# Patient Record
Sex: Female | Born: 1995 | Race: Black or African American | Hispanic: No | Marital: Single | Smoking: Never smoker
Health system: Southern US, Community
[De-identification: ages and names within clinical notes are randomized; demographics above are authoritative.]

## PROBLEM LIST (undated history)

## (undated) DIAGNOSIS — Q858 Other phakomatoses, not elsewhere classified: Secondary | ICD-10-CM

## (undated) DIAGNOSIS — N809 Endometriosis, unspecified: Secondary | ICD-10-CM

## (undated) DIAGNOSIS — R569 Unspecified convulsions: Secondary | ICD-10-CM

## (undated) DIAGNOSIS — I509 Heart failure, unspecified: Secondary | ICD-10-CM

## (undated) DIAGNOSIS — G931 Anoxic brain damage, not elsewhere classified: Secondary | ICD-10-CM

## (undated) DIAGNOSIS — K561 Intussusception: Secondary | ICD-10-CM

## (undated) DIAGNOSIS — I2510 Atherosclerotic heart disease of native coronary artery without angina pectoris: Secondary | ICD-10-CM

## (undated) HISTORY — PX: CARDIAC DEFIBRILLATOR PLACEMENT: SHX171

## (undated) HISTORY — PX: OTHER SURGICAL HISTORY: SHX169

## (undated) HISTORY — PX: ABDOMINAL SURGERY: SHX537

---
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
## 2016-10-05 ENCOUNTER — Encounter (HOSPITAL_COMMUNITY): Payer: Self-pay | Admitting: *Deleted

## 2016-10-05 ENCOUNTER — Emergency Department (HOSPITAL_COMMUNITY)
Admission: EM | Admit: 2016-10-05 | Discharge: 2016-10-05 | Disposition: A | Discharge status: Home / Self Care | Payer: Medicaid Other | Attending: Emergency Medicine | Admitting: Emergency Medicine

## 2016-10-05 DIAGNOSIS — R52 Pain, unspecified: Secondary | ICD-10-CM | POA: Diagnosis present

## 2016-10-05 DIAGNOSIS — Z5321 Procedure and treatment not carried out due to patient leaving prior to being seen by health care provider: Secondary | ICD-10-CM | POA: Diagnosis not present

## 2016-10-05 HISTORY — DX: Endometriosis, unspecified: N80.9

## 2016-10-05 HISTORY — DX: Atherosclerotic heart disease of native coronary artery without angina pectoris: I25.10

## 2016-10-05 HISTORY — DX: Intussusception: K56.1

## 2016-10-05 HISTORY — DX: Anoxic brain damage, not elsewhere classified: G93.1

## 2016-10-05 HISTORY — DX: Other phakomatoses, not elsewhere classified: Q85.8

## 2016-10-05 NOTE — ED Triage Notes (Signed)
Mother states pt grunts and cries when she tries to place her flat in bed, this started Sunday. Unknown location or reason for pain,

## 2016-10-05 NOTE — ED Notes (Signed)
Called to take to room  No response from lobby  

## 2016-10-12 ENCOUNTER — Encounter (HOSPITAL_COMMUNITY): Payer: Self-pay | Admitting: *Deleted

## 2016-10-12 ENCOUNTER — Emergency Department (HOSPITAL_COMMUNITY)
Admission: EM | Admit: 2016-10-12 | Discharge: 2016-10-13 | Disposition: A | Discharge status: Home / Self Care | Payer: Medicaid Other | Attending: Emergency Medicine | Admitting: Emergency Medicine

## 2016-10-12 DIAGNOSIS — Z5321 Procedure and treatment not carried out due to patient leaving prior to being seen by health care provider: Secondary | ICD-10-CM | POA: Insufficient documentation

## 2016-10-12 DIAGNOSIS — K5900 Constipation, unspecified: Secondary | ICD-10-CM | POA: Insufficient documentation

## 2016-10-12 LAB — COMPREHENSIVE METABOLIC PANEL
ALK PHOS: 64 U/L (ref 38–126)
ALT: 18 U/L (ref 14–54)
ANION GAP: 9 (ref 5–15)
AST: 21 U/L (ref 15–41)
Albumin: 4.2 g/dL (ref 3.5–5.0)
BILIRUBIN TOTAL: 0.5 mg/dL (ref 0.3–1.2)
BUN: 12 mg/dL (ref 6–20)
CALCIUM: 9.7 mg/dL (ref 8.9–10.3)
CO2: 23 mmol/L (ref 22–32)
CREATININE: 0.4 mg/dL — AB (ref 0.44–1.00)
Chloride: 108 mmol/L (ref 101–111)
GFR calc non Af Amer: >60 mL/min (ref >60)
GLUCOSE: 90 mg/dL (ref 65–99)
Potassium: 4 mmol/L (ref 3.5–5.1)
SODIUM: 140 mmol/L (ref 135–145)
TOTAL PROTEIN: 8.2 g/dL — AB (ref 6.5–8.1)

## 2016-10-12 LAB — CBC
HCT: 34.8 % — ABNORMAL LOW (ref 36.0–46.0)
HEMOGLOBIN: 10.6 g/dL — AB (ref 12.0–15.0)
MCH: 23.3 pg — AB (ref 26.0–34.0)
MCHC: 30.5 g/dL (ref 30.0–36.0)
MCV: 76.5 fL — ABNORMAL LOW (ref 78.0–100.0)
PLATELETS: 172 10*3/uL (ref 150–400)
RBC: 4.55 MIL/uL (ref 3.87–5.11)
RDW: 16.5 % — ABNORMAL HIGH (ref 11.5–15.5)
WBC: 6.3 10*3/uL (ref 4.0–10.5)

## 2016-10-12 LAB — LIPASE, BLOOD: Lipase: 42 U/L (ref 11–51)

## 2016-10-12 NOTE — ED Triage Notes (Signed)
Pt's mother reports when pt woke up today she is not her usual self.  Reports pt has been grunting.  States pt normally does not have a BM for a week which is normal for pt, she is due today and has not had one.  G-tube in place.  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

## 2016-10-12 NOTE — ED Notes (Signed)
Bed: WTR5 Expected date:  Expected time:  Means of arrival:  Comments: 

## 2016-10-13 NOTE — ED Notes (Addendum)
Pt's mother reports she is taking the pt home and will take her to [facility].  This nurse apologized for the long wait and was instructed to return if pt worsens.  She verbalized understanding.

## 2016-10-14 ENCOUNTER — Emergency Department: Payer: Medicaid Other

## 2016-10-14 ENCOUNTER — Emergency Department
Admission: EM | Admit: 2016-10-14 | Discharge: 2016-10-14 | Disposition: A | Discharge status: Home / Self Care | Payer: Medicaid Other | Attending: Emergency Medicine | Admitting: Emergency Medicine

## 2016-10-14 DIAGNOSIS — K5900 Constipation, unspecified: Secondary | ICD-10-CM | POA: Diagnosis not present

## 2016-10-14 DIAGNOSIS — I2510 Atherosclerotic heart disease of native coronary artery without angina pectoris: Secondary | ICD-10-CM | POA: Insufficient documentation

## 2016-10-14 DIAGNOSIS — R1084 Generalized abdominal pain: Secondary | ICD-10-CM | POA: Insufficient documentation

## 2016-10-14 DIAGNOSIS — R4182 Altered mental status, unspecified: Secondary | ICD-10-CM | POA: Diagnosis not present

## 2016-10-14 DIAGNOSIS — I5042 Chronic combined systolic (congestive) and diastolic (congestive) heart failure: Secondary | ICD-10-CM | POA: Diagnosis not present

## 2016-10-14 HISTORY — DX: Heart failure, unspecified: I50.9

## 2016-10-14 LAB — COMPREHENSIVE METABOLIC PANEL
ALK PHOS: 66 U/L (ref 38–126)
ALT: 16 U/L (ref 14–54)
AST: 19 U/L (ref 15–41)
Albumin: 4.6 g/dL (ref 3.5–5.0)
Anion gap: 10 (ref 5–15)
BILIRUBIN TOTAL: 0.7 mg/dL (ref 0.3–1.2)
BUN: 6 mg/dL (ref 6–20)
CALCIUM: 9.9 mg/dL (ref 8.9–10.3)
CO2: 23 mmol/L (ref 22–32)
CREATININE: 0.53 mg/dL (ref 0.44–1.00)
Chloride: 105 mmol/L (ref 101–111)
Glucose, Bld: 76 mg/dL (ref 65–99)
Potassium: 3.7 mmol/L (ref 3.5–5.1)
Sodium: 138 mmol/L (ref 135–145)
Total Protein: 8.7 g/dL — ABNORMAL HIGH (ref 6.5–8.1)

## 2016-10-14 LAB — CBC WITH DIFFERENTIAL/PLATELET
BASOS PCT: 1 %
Basophils Absolute: 0 10*3/uL (ref 0–0.1)
EOS ABS: 0.1 10*3/uL (ref 0–0.7)
Eosinophils Relative: 4 %
HCT: 34.7 % — ABNORMAL LOW (ref 35.0–47.0)
HEMOGLOBIN: 11 g/dL — AB (ref 12.0–16.0)
Lymphocytes Relative: 57 %
Lymphs Abs: 1.8 10*3/uL (ref 1.0–3.6)
MCH: 23.7 pg — ABNORMAL LOW (ref 26.0–34.0)
MCHC: 31.7 g/dL — AB (ref 32.0–36.0)
MCV: 74.9 fL — ABNORMAL LOW (ref 80.0–100.0)
Monocytes Absolute: 0.3 10*3/uL (ref 0.2–0.9)
Monocytes Relative: 9 %
NEUTROS PCT: 29 %
Neutro Abs: 0.9 10*3/uL — ABNORMAL LOW (ref 1.4–6.5)
PLATELETS: 162 10*3/uL (ref 150–440)
RBC: 4.64 MIL/uL (ref 3.80–5.20)
RDW: 17.2 % — AB (ref 11.5–14.5)
WBC: 3.1 10*3/uL — AB (ref 3.6–11.0)

## 2016-10-14 LAB — LIPASE, BLOOD: Lipase: 39 U/L (ref 11–51)

## 2016-10-14 LAB — HCG, QUANTITATIVE, PREGNANCY

## 2016-10-14 MED ORDER — SORBITOL 70 % SOLN
960.0000 mL | TOPICAL_OIL | ORAL | Status: AC
  Administered 2016-10-14: 960 mL via RECTAL
  Filled 2016-10-14: qty 240

## 2016-10-14 MED ORDER — IOPAMIDOL (ISOVUE-300) INJECTION 61%
30.0000 mL | Freq: Once | INTRAVENOUS | Status: AC
  Administered 2016-10-14: 30 mL via ORAL
  Filled 2016-10-14: qty 30

## 2016-10-14 MED ORDER — KETOROLAC TROMETHAMINE 30 MG/ML IJ SOLN
15.0000 mg | INTRAMUSCULAR | Status: AC
  Administered 2016-10-14: 15 mg via INTRAVENOUS
  Filled 2016-10-14: qty 1

## 2016-10-14 MED ORDER — IOPAMIDOL (ISOVUE-300) INJECTION 61%
75.0000 mL | Freq: Once | INTRAVENOUS | Status: AC | PRN
  Administered 2016-10-14: 75 mL via INTRAVENOUS
  Filled 2016-10-14: qty 75

## 2016-10-14 NOTE — ED Notes (Signed)
Attempt X 1 for lab draw, unsuccessful.

## 2016-10-14 NOTE — ED Notes (Signed)
Pharmacy called about enema, states they are working on it and will tube it to ED

## 2016-10-14 NOTE — ED Provider Notes (Signed)
[facility] Emergency Department Provider Note  ____________________________________________  Time seen: Approximately 3:33 PM  I have reviewed the triage vital signs and the nursing notes.   HISTORY  Chief Complaint Constipation  Level 5 Caveat: Portions of the History and Physical were unable to be obtained due to altered mental statusdue to chronic anoxic brain injury and nonverbal state. history obtained from mother at bedside  HPI Mallory Gonzalez is a 21 y.o. female brought to the ED due to constipation. The patient has not had a bowel movement in 2 weeks. Normally she has a bowel movement once a week. When she did not have a bowel movement a week ago, her mother started giving her MiraLAX. She's had 3 full cups of MiraLAX over the past few days without any stool passage whatsoever. The patient does not take anything by mouth and is fed solely through a G-tube. She's had multiple abdominal surgeries. She is bedbound mother reports the patient has exhibited grunting and crying which she interprets as indicative of abdominal pain.    Past Medical History:  Diagnosis Date  . Anoxia of brain (HCC)   . CHF (congestive heart failure) (HCC)   . Coronary artery disease   . Endometriosis   . Intussusception (HCC)   . Peutz-Jeghers polyps of small bowel (HCC)      There are no active problems to display for this patient.    Past Surgical History:  Procedure Laterality Date  . ABDOMINAL SURGERY    . CARDIAC DEFIBRILLATOR PLACEMENT    . micky button       Prior to Admission medications   Not on File     Allergies Aspirin and Penicillins   No family history on file.  Social History Social History  Substance Use Topics  . Smoking status: Never Smoker  . Smokeless tobacco: Never Used  . Alcohol use No    Review of Systems Limited by patient's nonverbal state Constitutional:   No fever or chills.   Gastrointestinal:   positive for  abdominal pain and constipation.   ____________________________________________   PHYSICAL EXAM:  VITAL SIGNS: ED Triage Vitals [10/14/16 1425]  Enc Vitals Group     BP (!) 89/65     Pulse Rate 87     Resp 20     Temp 99.3 F (37.4 C)     Temp Source Axillary     SpO2 100 %     Weight 110 lb (49.9 kg)     Height      Head Circumference      Peak Flow      Pain Score      Pain Loc      Pain Edu?      Excl. in GC?     Vital signs reviewed, nursing assessments reviewed.   Constitutional:   awake. not in distress. Eyes:   No scleral icterus.   PERRL. ENT   Head:   Normocephalic and atraumatic.   Nose:   No congestion/rhinnorhea.    Mouth/Throat:   MMM, no pharyngeal erythema. No peritonsillar mass.    Neck:   No meningismus. Full ROM Hematological/Lymphatic/Immunilogical:   No cervical lymphadenopathy. Cardiovascular:   RRR. Symmetric bilateral radial and DP pulses.  No murmurs.  Respiratory:   Normal respiratory effort without tachypnea/retractions. Breath sounds are clear and equal bilaterally. No wheezes/rales/rhonchi. Gastrointestinal:   Soft and nontender.mildly distended.   No rebound, rigidity, or guarding. Genitourinary:   deferred Musculoskeletal:   Normal  range of motion in all extremities. No joint effusions.  No lower extremity tenderness.  No edema. Neurologic:   Nonverbal Severe contractures of upper extremities.  at apparent neurologic baseline per mother No gross focal neurologic deficits are appreciated.  Skin:    Skin is warm, dry and intact. No rash noted.  No petechiae, purpura, or bullae.  ____________________________________________    LABS (pertinent positives/negatives) (all labs ordered are listed, but only abnormal results are displayed) Labs Reviewed  COMPREHENSIVE METABOLIC PANEL - Abnormal; Notable for the following:       Result Value   Total Protein 8.7 (*)    All other components within normal limits  CBC WITH  DIFFERENTIAL/PLATELET - Abnormal; Notable for the following:    WBC 3.1 (*)    Hemoglobin 11.0 (*)    HCT 34.7 (*)    MCV 74.9 (*)    MCH 23.7 (*)    MCHC 31.7 (*)    RDW 17.2 (*)    Neutro Abs 0.9 (*)    All other components within normal limits  LIPASE, BLOOD  HCG, QUANTITATIVE, PREGNANCY  URINALYSIS, COMPLETE (UACMP) WITH MICROSCOPIC   ____________________________________________   EKG    ____________________________________________    RADIOLOGY  Ct Abdomen Pelvis W Contrast  Result Date: 10/14/2016 CLINICAL DATA:  Grunting for 1 week. No bowel movement for 2 weeks. History of anoxic brain injury, Peutz-Jeghers polyps, small bowel intussusception. EXAM: CT ABDOMEN AND PELVIS WITH CONTRAST TECHNIQUE: Multidetector CT imaging of the abdomen and pelvis was performed using the standard protocol following bolus administration of intravenous contrast. CONTRAST:  75mL ISOVUE-300 IOPAMIDOL (ISOVUE-300) INJECTION 61% COMPARISON:  None. FINDINGS: LOWER CHEST: Lung bases are clear. Included heart size is normal. No pericardial effusion. Streak artifact from cardiac pacemaker. HEPATOBILIARY: Liver and gallbladder are normal. PANCREAS: Normal. SPLEEN: Normal. ADRENALS/URINARY TRACT: Kidneys are orthotopic, demonstrating symmetric enhancement. No nephrolithiasis, hydronephrosis or solid renal masses. The unopacified ureters are normal in course and caliber. Urinary bladder is partially distended and unremarkable. Normal adrenal glands. STOMACH/BOWEL: Intraluminal gastrostomy tube. The stomach, small and large bowel are normal in course and caliber without inflammatory changes. Moderate to large amount of retained large bowel stool. 13 mm small bowel filling defect could represent followup (coronal 19/63). The appendix is not discretely identified, however there are no inflammatory changes in the right lower quadrant. VASCULAR/LYMPHATIC: Aortoiliac vessels are normal in course and caliber. No  lymphadenopathy by CT size criteria. REPRODUCTIVE: Normal. OTHER: Small amount of free fluid in the pelvis this likely physiologic. No focal fluid collections, no intraperitoneal free air. MUSCULOSKELETAL: Nonacute. Osteopenic. Patient is contracted. Grade 2 L5-S1 anterolisthesis with chronic, healed L5 pars interarticularis defects and severe L5-S1 degenerative disc. IMPRESSION: 1. Moderate to large volume retained large bowel stool without bowel obstruction. 2. Suspected small bowel follow-up without intussusception. Electronically Signed   By: Awilda Metro M.D.   On: 10/14/2016 17:42    ____________________________________________   PROCEDURES Procedures  ____________________________________________   INITIAL IMPRESSION / ASSESSMENT AND PLAN / ED COURSE  Pertinent labs & imaging results that were available during my care of the patient were reviewed by me and considered in my medical decision making (see chart for details).  patient presents with constipation, abdominal pain. When lying supine she also seemed to be having trouble with esophageal regurgitation. Given her chronic immobility and multiple abdominal surgeries, this is concerning for bowel obstruction. I will obtain a CT scan of the abdomen and pelvis along with labs. Low suspicion of perforation appendicitis  cholecystitis AAA diverticulitis or torsion. Possibly constipation.  Clinical Course as of Oct 15 2103  Fri Oct 14, 2016  1610 CT negative. No SBO or other acute findings. Will give enema for constipation.   [PS]  2104 Pt had a large BM after enema.  Improved. Will DC home with mother who can continue miralax per G tube  [PS]    Clinical Course User Index [PS] Sharman Cheek, MD     ____________________________________________   FINAL CLINICAL IMPRESSION(S) / ED DIAGNOSES  Final diagnoses:  Generalized abdominal pain  Constipation, unspecified constipation type      New Prescriptions   No  medications on file     Portions of this note were generated with dragon dictation software. Dictation errors may occur despite best attempts at proofreading.    Sharman Cheek, MD 10/14/16 2105

## 2016-10-14 NOTE — ED Notes (Signed)
Pt's mother verbalizes understanding of discharge instructions.

## 2016-10-14 NOTE — ED Notes (Signed)
Pt presents with mother, who is her primary caregiver. Mother assisting with administration of contrast solution thru g-tube.

## 2016-10-14 NOTE — ED Notes (Signed)
Pt unable to go to xray before negative pregnancy test per xray staff. Pt incontinent of urine.

## 2016-10-14 NOTE — ED Triage Notes (Signed)
Pt brought in via POV with mother. Pt has been grunting over past week. No BM X 2 weeks. Pt is nonverbal. Stomach soft to touch.

## 2017-09-22 ENCOUNTER — Emergency Department: Payer: Medicaid Other

## 2017-09-22 ENCOUNTER — Encounter: Payer: Self-pay | Admitting: Emergency Medicine

## 2017-09-22 ENCOUNTER — Emergency Department
Admission: EM | Admit: 2017-09-22 | Discharge: 2017-09-22 | Disposition: A | Discharge status: Home / Self Care | Payer: Medicaid Other | Attending: Emergency Medicine | Admitting: Emergency Medicine

## 2017-09-22 DIAGNOSIS — R52 Pain, unspecified: Secondary | ICD-10-CM

## 2017-09-22 DIAGNOSIS — I2510 Atherosclerotic heart disease of native coronary artery without angina pectoris: Secondary | ICD-10-CM | POA: Insufficient documentation

## 2017-09-22 DIAGNOSIS — Z95810 Presence of automatic (implantable) cardiac defibrillator: Secondary | ICD-10-CM | POA: Diagnosis not present

## 2017-09-22 DIAGNOSIS — I509 Heart failure, unspecified: Secondary | ICD-10-CM | POA: Insufficient documentation

## 2017-09-22 HISTORY — DX: Unspecified convulsions: R56.9

## 2017-09-22 LAB — URINALYSIS, COMPLETE (UACMP) WITH MICROSCOPIC
BILIRUBIN URINE: NEGATIVE
Bacteria, UA: NONE SEEN
Glucose, UA: NEGATIVE mg/dL
Hgb urine dipstick: NEGATIVE
KETONES UR: NEGATIVE mg/dL
Leukocytes, UA: NEGATIVE
Nitrite: NEGATIVE
PROTEIN: 30 mg/dL — AB
Specific Gravity, Urine: 1.028 (ref 1.005–1.030)
pH: 5 (ref 5.0–8.0)

## 2017-09-22 LAB — COMPREHENSIVE METABOLIC PANEL
ALBUMIN: 4 g/dL (ref 3.5–5.0)
ALT: 19 U/L (ref 0–44)
ANION GAP: 7 (ref 5–15)
AST: 21 U/L (ref 15–41)
Alkaline Phosphatase: 60 U/L (ref 38–126)
BUN: 14 mg/dL (ref 6–20)
CALCIUM: 9.4 mg/dL (ref 8.9–10.3)
CO2: 26 mmol/L (ref 22–32)
Chloride: 106 mmol/L (ref 98–111)
Creatinine, Ser: 0.37 mg/dL — ABNORMAL LOW (ref 0.44–1.00)
GFR calc Af Amer: >60 mL/min (ref >60)
GFR calc non Af Amer: >60 mL/min (ref >60)
GLUCOSE: 130 mg/dL — AB (ref 70–99)
POTASSIUM: 4 mmol/L (ref 3.5–5.1)
Sodium: 139 mmol/L (ref 135–145)
TOTAL PROTEIN: 7.7 g/dL (ref 6.5–8.1)
Total Bilirubin: 0.5 mg/dL (ref 0.3–1.2)

## 2017-09-22 LAB — CBC WITH DIFFERENTIAL/PLATELET
Basophils Absolute: 0 10*3/uL (ref 0–0.1)
Basophils Relative: 1 %
EOS ABS: 0.1 10*3/uL (ref 0–0.7)
EOS PCT: 4 %
HCT: 34.7 % — ABNORMAL LOW (ref 35.0–47.0)
Hemoglobin: 11.2 g/dL — ABNORMAL LOW (ref 12.0–16.0)
LYMPHS PCT: 33 %
Lymphs Abs: 1.3 10*3/uL (ref 1.0–3.6)
MCH: 27 pg (ref 26.0–34.0)
MCHC: 32.3 g/dL (ref 32.0–36.0)
MCV: 83.5 fL (ref 80.0–100.0)
Monocytes Absolute: 0.5 10*3/uL (ref 0.2–0.9)
Monocytes Relative: 12 %
Neutro Abs: 2 10*3/uL (ref 1.4–6.5)
Neutrophils Relative %: 50 %
PLATELETS: 174 10*3/uL (ref 150–440)
RBC: 4.15 MIL/uL (ref 3.80–5.20)
RDW: 15.4 % — ABNORMAL HIGH (ref 11.5–14.5)
WBC: 4 10*3/uL (ref 3.6–11.0)

## 2017-09-22 LAB — LIPASE, BLOOD: Lipase: 42 U/L (ref 11–51)

## 2017-09-22 LAB — LACTIC ACID, PLASMA: LACTIC ACID, VENOUS: 1.9 mmol/L (ref 0.5–1.9)

## 2017-09-22 LAB — PREGNANCY, URINE: Preg Test, Ur: NEGATIVE

## 2017-09-22 MED ORDER — ACETAMINOPHEN 500 MG PO TABS
1000.0000 mg | ORAL_TABLET | Freq: Once | ORAL | Status: AC
  Administered 2017-09-22: 1000 mg via ORAL
  Filled 2017-09-22: qty 2

## 2017-09-22 NOTE — ED Provider Notes (Signed)
[facility] Emergency Department Provider Note   ____________________________________________   I have reviewed the triage vital signs and the nursing notes.   HISTORY  Chief Complaint Back Pain   History limited by and level 5 caveat due to: Chronic medical condition, history obtained from family   HPI Mallory Gonzalez is a 22 y.o. female who presents to the emergency department today because of concern for patient being in pain and bony protuberance noted to left buttock. Mother states that for the past day the patient has been grunting. This is the way that the patient communicates pain. Mother states that she noticed a bony protuberance in the left buttock that she has not noticed before. The patient has not had any skin breakdown in that area. The patient has not been noticed to have any fevers, nausea, vomiting, diarrhea or any other new or concerning symptoms.  Per medical record review patient has a history of seizures, anoxia of brain.  Past Medical History:  Diagnosis Date  . Anoxia of brain (HCC)   . CHF (congestive heart failure) (HCC)   . Coronary artery disease   . Endometriosis   . Intussusception (HCC)   . Peutz-Jeghers polyps of small bowel (HCC)   . Seizures (HCC)     There are no active problems to display for this patient.   Past Surgical History:  Procedure Laterality Date  . ABDOMINAL SURGERY    . CARDIAC DEFIBRILLATOR PLACEMENT    . CARDIAC DEFIBRILLATOR PLACEMENT    . micky button      Prior to Admission medications   Not on File    Allergies Aspirin and Penicillins  No family history on file.  Social History Social History   Tobacco Use  . Smoking status: Never Smoker  . Smokeless tobacco: Never Used  Substance Use Topics  . Alcohol use: No  . Drug use: No    Review of Systems limited due to medical condition. ROS obtained from family. Constitutional: No fever Respiratory: Denies shortness of  breath. Gastrointestinal: No vomiting.  No diarrhea.  Musculoskeletal: Bony protuberance to left buttock. Skin: Negative for rash.  ____________________________________________   PHYSICAL EXAM:  VITAL SIGNS: ED Triage Vitals [09/22/17 1318]  Enc Vitals Group     BP 91/62     Pulse Rate (!) 102     Resp 16     Temp 99.3 F (37.4 C)     Temp Source Axillary     SpO2 97 %     Weight 109 lb (49.4 kg)     Height 5\' 6"  (1.676 m)    Constitutional: Awake, appears alert. Non verbal. Eyes: Conjunctivae are normal.  ENT      Head: Normocephalic and atraumatic.      Nose: No congestion/rhinnorhea.      Mouth/Throat: Mucous membranes are moist.      Neck: No stridor. Hematological/Lymphatic/Immunilogical: No cervical lymphadenopathy. Cardiovascular: Normal rate, regular rhythm.  No murmurs, rubs, or gallops. Respiratory: Normal respiratory effort without tachypnea nor retractions. Breath sounds are clear and equal bilaterally. No wheezes/rales/rhonchi. Gastrointestinal: Soft and non tender. No rebound. No guarding.  Genitourinary: Deferred Musculoskeletal: Significant atrophy in extremities, upper extremities held in contraction. Neurologic:  Sequelae of chronic medical condition Skin:  Skin is warm, dry and intact. No rash noted.  ____________________________________________    LABS (pertinent positives/negatives)  Upreg neg Lipase 42 CMP wnl except glu 130, cr 0.37 UA not consistent with infection CBC wbc 4.0, hgb 11.2, plt 174  Lactic 1.9  ____________________________________________   EKG  None  ____________________________________________    RADIOLOGY  None  ____________________________________________   PROCEDURES  Procedures  ____________________________________________   INITIAL IMPRESSION / ASSESSMENT AND PLAN / ED COURSE  Pertinent labs & imaging results that were available during my care of the patient were reviewed by me and considered in  my medical decision making (see chart for details).   Patient presented to the emergency department brought in by mother because of concerns for possible pain.  Mother was concerned for bony protuberance noted.  X-ray shows looks like a small outgrowth of bone and inferior rami.  However there is no skin breakdown.  Patient had blood work and urine check to see if there is any other obvious source of the pain.  None was identified.  Discussed this with the mother.  We did discuss return precautions.  Patient  has follow-up appointment scheduled next week.   ____________________________________________   FINAL CLINICAL IMPRESSION(S) / ED DIAGNOSES  Final diagnoses:  Pain     Note: This dictation was prepared with Dragon dictation. Any transcriptional errors that result from this process are unintentional     Phineas Semen, MD 09/22/17 1732

## 2017-09-22 NOTE — ED Triage Notes (Signed)
Patient presents to the ED.  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  Mother states she noticed for the past few days that when she is lying down on her back, she is grunting.  Mother states that yesterday when she was changing her brief she noticed, "a bone sticking out from above her right butt cheek."  Mother denies any skin break down around area.  This RN is unable to visualize area during triage due to patient's mobility issues.

## 2017-09-22 NOTE — ED Notes (Signed)
Pt is was taken to her POV in [facility] with mother. VSS, NAD, Discharge instructions  and follow up discussed with mother. All questions and concerns addressed.

## 2017-09-22 NOTE — Discharge Instructions (Addendum)
Please seek medical attention for any high fevers, chest pain, shortness of breath, change in behavior, persistent vomiting, bloody stool or any other new or concerning symptoms.  

## 2017-10-04 IMAGING — CT CT ABD-PELV W/ CM
2 of 5 series · 15 of 46 positions shown, 17 images · IV contrast (APPLIED)
Comparison: None.

CLINICAL DATA: Grunting for 1 week. No bowel movement for 2 weeks.
History of anoxic brain injury, Peutz-Jeghers polyps, small bowel
intussusception.

EXAM:
CT ABDOMEN AND PELVIS WITH CONTRAST
TECHNIQUE: Multidetector CT imaging of the abdomen and pelvis was performed
using the standard protocol following bolus administration of
intravenous contrast.
CONTRAST:  75mL RYOVUG-633 IOPAMIDOL (RYOVUG-633) INJECTION 61%

[Series 2: axial st · axial · 0.67mm/px · z∈[-396,-16]mm · 12 of 90 slices shown, 14 images]
[im 7/90  soft-tissue]
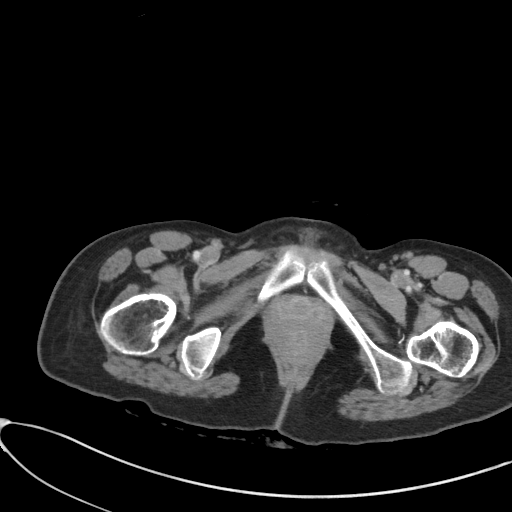
[im 7/90  bone]
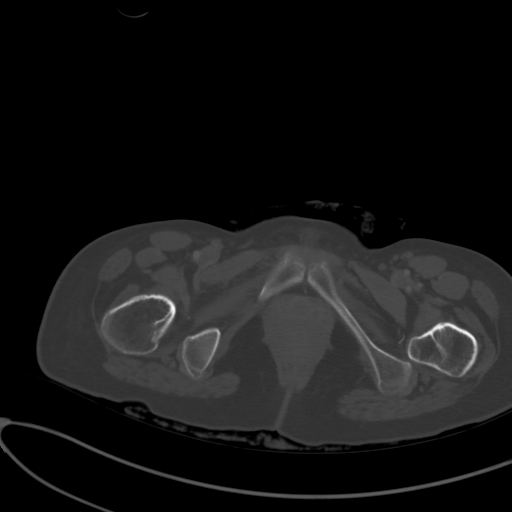
[im 13/90  soft-tissue]
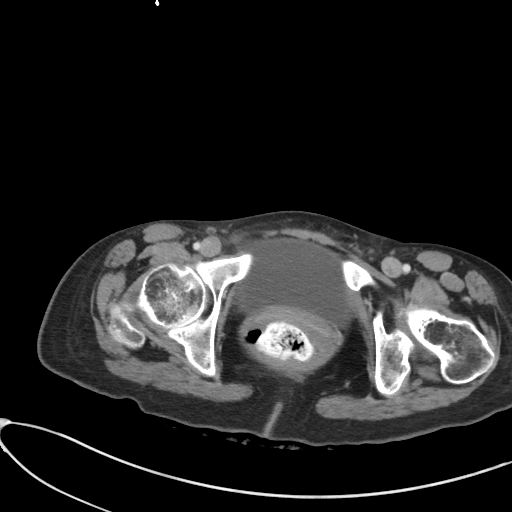
[im 20/90  soft-tissue]
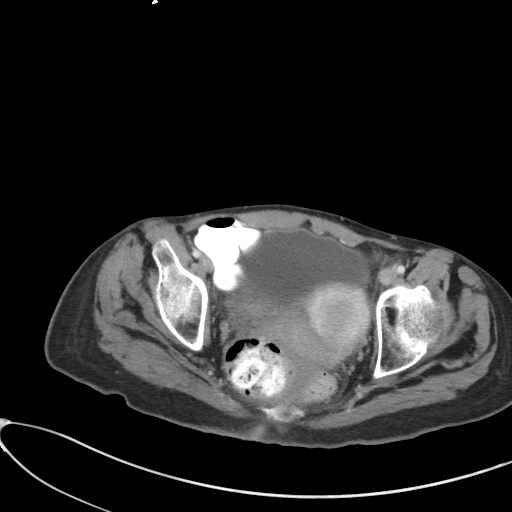
[im 26/90  soft-tissue]
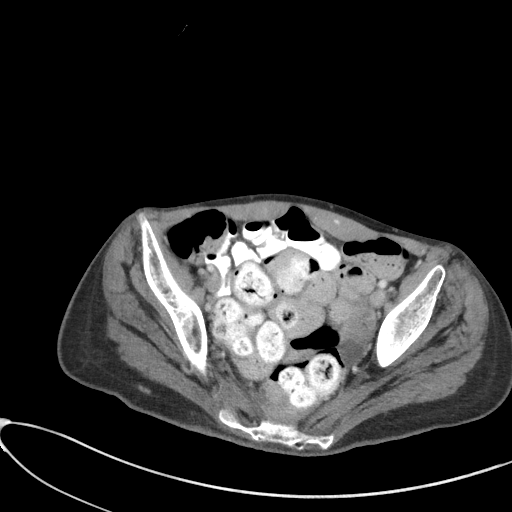
[im 32/90  soft-tissue]
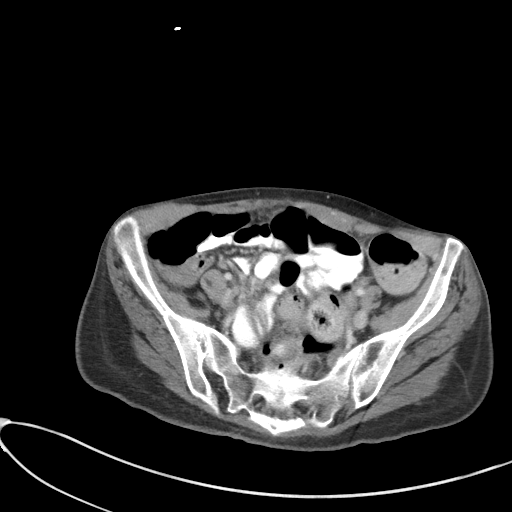
[im 39/90  soft-tissue]
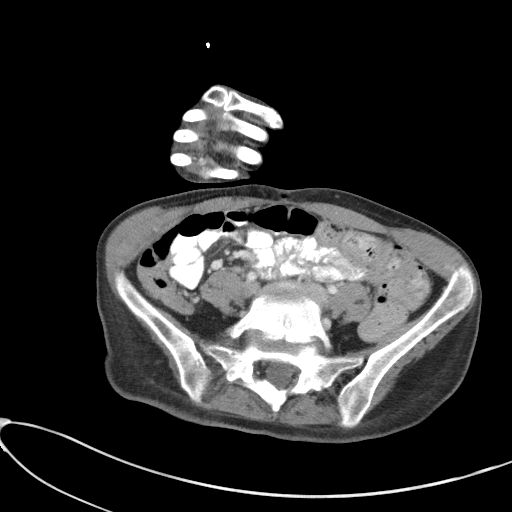
[im 51/90  soft-tissue]
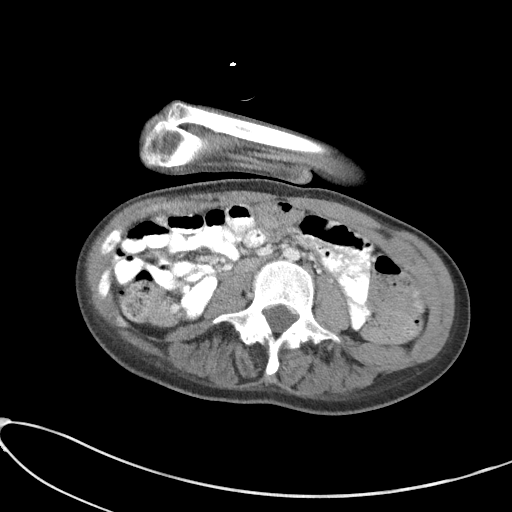
[im 58/90  soft-tissue]
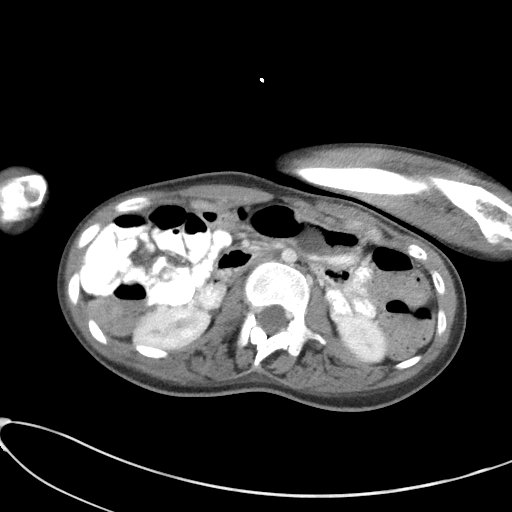
[im 64/90  soft-tissue]
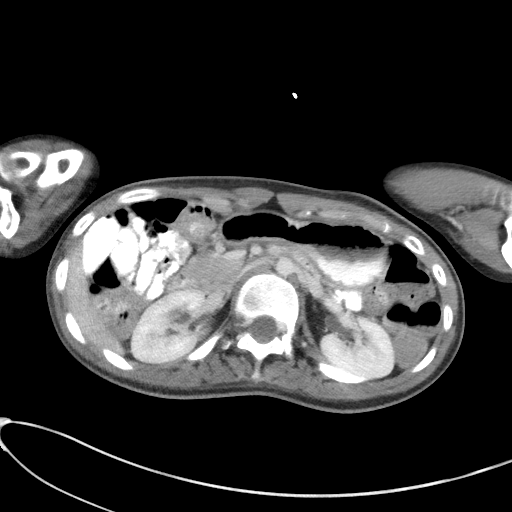
[im 64/90  bone]
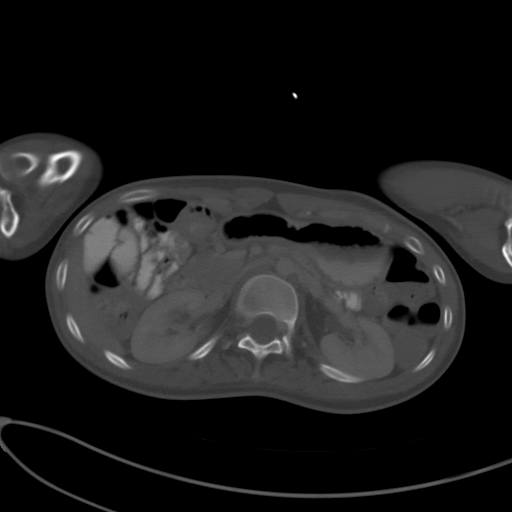
[im 70/90  soft-tissue]
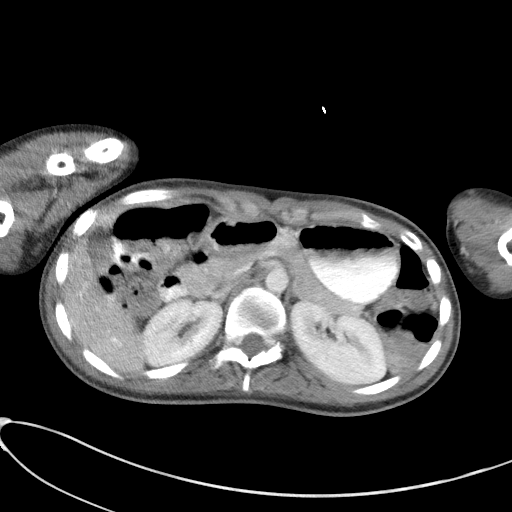
[im 77/90  soft-tissue]
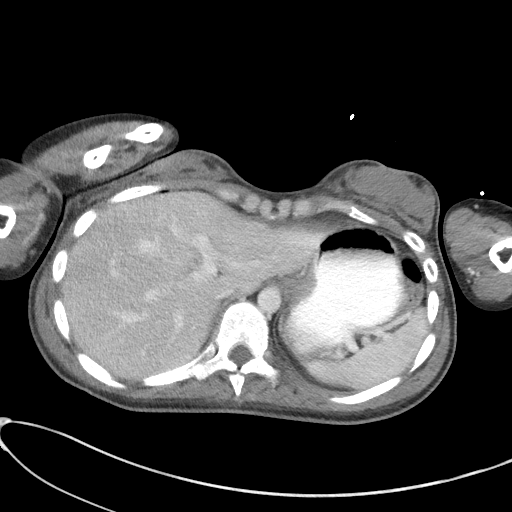
[im 83/90  soft-tissue]
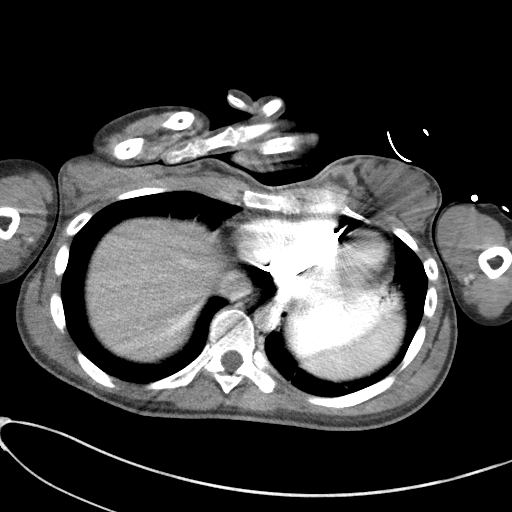

[Series 6: coronal st · coronal · 0.59mm/px · 3 of 63 slices shown]
[im 21/63  soft-tissue]
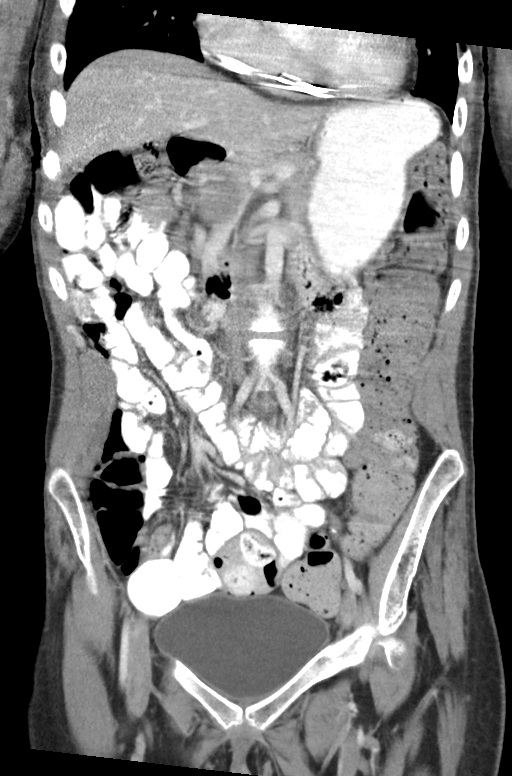
[im 28/63  soft-tissue]
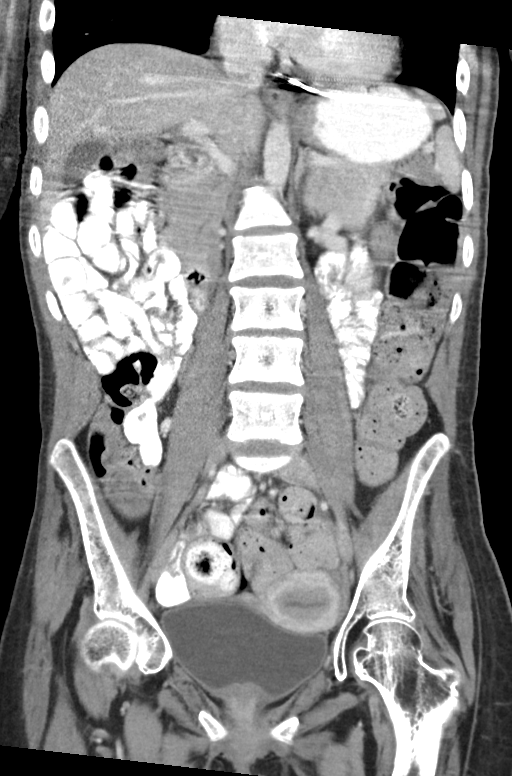
[im 35/63  soft-tissue]
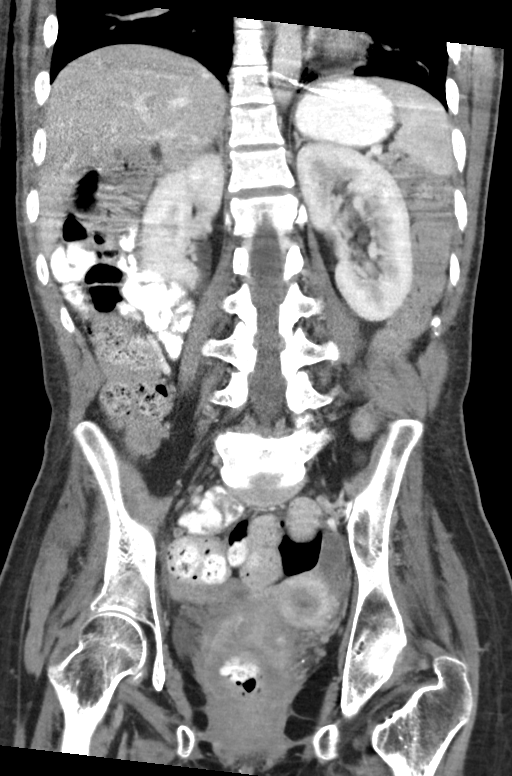

[15 of 46 positions shown; findings below may reference images not displayed]

FINDINGS: LOWER CHEST: Lung bases are clear. Included heart size is normal. No
pericardial effusion. Streak artifact from cardiac pacemaker.

HEPATOBILIARY: Liver and gallbladder are normal.

PANCREAS: Normal.

SPLEEN: Normal.

ADRENALS/URINARY TRACT: Kidneys are orthotopic, demonstrating
symmetric enhancement. No nephrolithiasis, hydronephrosis or solid
renal masses. The unopacified ureters are normal in course and
caliber. Urinary bladder is partially distended and unremarkable.
Normal adrenal glands.

STOMACH/BOWEL: Intraluminal gastrostomy tube. The stomach, small and
large bowel are normal in course and caliber without inflammatory
changes. Moderate to large amount of retained large bowel stool. 13
mm small bowel filling defect could represent followup (coronal
19/63). The appendix is not discretely identified, however there are
no inflammatory changes in the right lower quadrant.

VASCULAR/LYMPHATIC: Aortoiliac vessels are normal in course and
caliber. No lymphadenopathy by CT size criteria.

REPRODUCTIVE: Normal.

OTHER: Small amount of free fluid in the pelvis this likely
physiologic. No focal fluid collections, no intraperitoneal free
air.

MUSCULOSKELETAL: Nonacute. Osteopenic. Patient is contracted. Grade
2 L5-S1 anterolisthesis with chronic, healed L5 pars
interarticularis defects and severe L5-S1 degenerative disc.
IMPRESSION: 1. Moderate to large volume retained large bowel stool without bowel
obstruction.
2. Suspected small bowel follow-up without intussusception.

## 2018-09-12 IMAGING — DX DG PELVIS 1-2V
1 series · 1 of 1 positions shown · non-contrast
Comparison: 10/14/2016 CT pelvis

CLINICAL DATA: Increase grunting, quadriplegic, bony prominence at
RIGHT buttock

EXAM:
PELVIS - 1-2 VIEW

[pelvis ap]
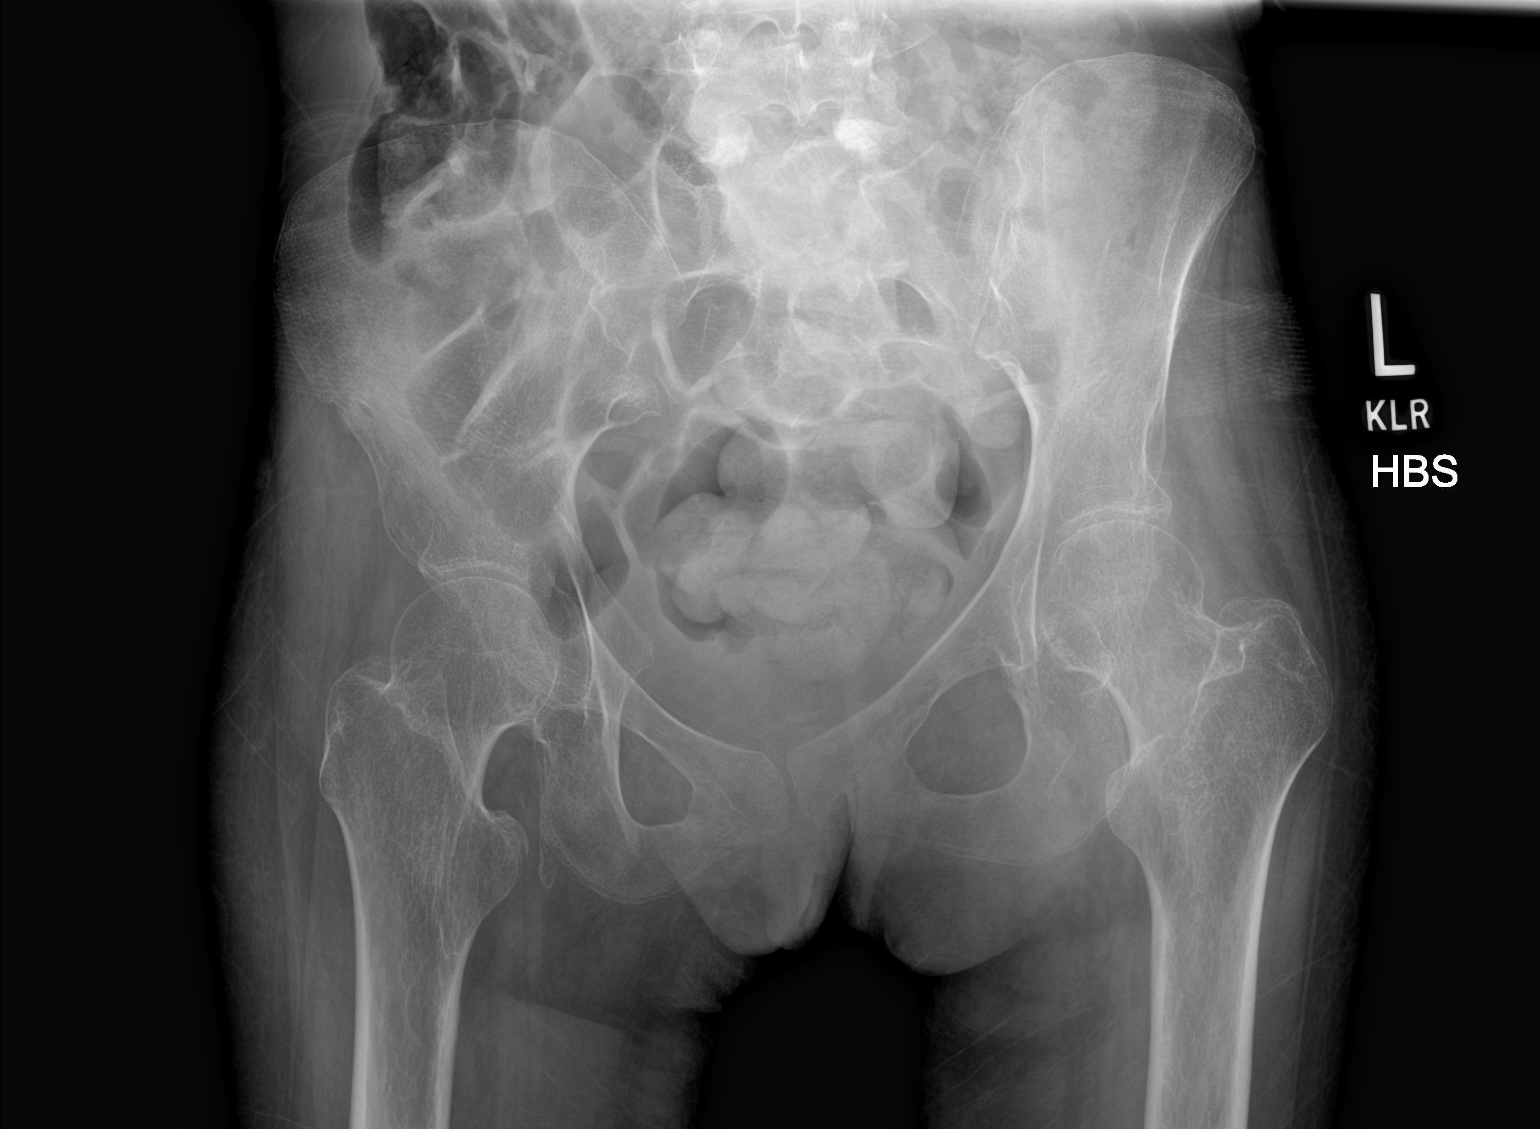

[1 of 1 positions shown; findings below may reference images not displayed]

FINDINGS: Osseous demineralization.

Hip and SI joint spaces preserved.

No fracture, dislocation or bone destruction.
IMPRESSION: No acute osseous abnormalities.
